# Patient Record
Sex: Male | Born: 1960 | Race: White | Hispanic: No | Marital: Single | Smoking: Current every day smoker
Health system: Southern US, Community
[De-identification: ages and names within clinical notes are randomized; demographics above are authoritative.]

## PROBLEM LIST (undated history)

## (undated) DIAGNOSIS — B1920 Unspecified viral hepatitis C without hepatic coma: Secondary | ICD-10-CM

---

## 1998-11-07 ENCOUNTER — Encounter: Payer: Self-pay | Admitting: Emergency Medicine

## 1998-11-07 ENCOUNTER — Emergency Department (HOSPITAL_COMMUNITY): Admission: EM | Admit: 1998-11-07 | Discharge: 1998-11-07 | Payer: Self-pay | Admitting: Emergency Medicine

## 1999-07-26 ENCOUNTER — Encounter: Admission: RE | Admit: 1999-07-26 | Discharge: 1999-10-24 | Payer: Self-pay

## 1999-09-15 ENCOUNTER — Emergency Department (HOSPITAL_COMMUNITY): Admission: EM | Admit: 1999-09-15 | Discharge: 1999-09-15 | Payer: Self-pay | Admitting: *Deleted

## 1999-11-26 ENCOUNTER — Emergency Department (HOSPITAL_COMMUNITY): Admission: EM | Admit: 1999-11-26 | Discharge: 1999-11-26 | Payer: Self-pay | Admitting: Emergency Medicine

## 1999-11-27 ENCOUNTER — Encounter: Payer: Self-pay | Admitting: Emergency Medicine

## 2000-08-14 ENCOUNTER — Emergency Department (HOSPITAL_COMMUNITY): Admission: EM | Admit: 2000-08-14 | Discharge: 2000-08-15 | Payer: Self-pay | Admitting: Emergency Medicine

## 2000-08-15 ENCOUNTER — Encounter: Payer: Self-pay | Admitting: Emergency Medicine

## 2004-08-29 ENCOUNTER — Inpatient Hospital Stay (HOSPITAL_COMMUNITY): Admission: EM | Admit: 2004-08-29 | Discharge: 2004-09-01 | Payer: Self-pay | Admitting: Psychiatry

## 2004-08-29 ENCOUNTER — Ambulatory Visit: Payer: Self-pay | Admitting: Psychiatry

## 2005-02-21 ENCOUNTER — Ambulatory Visit: Payer: Self-pay

## 2007-09-23 ENCOUNTER — Emergency Department (HOSPITAL_COMMUNITY): Admission: EM | Admit: 2007-09-23 | Discharge: 2007-09-23 | Payer: Self-pay | Admitting: Emergency Medicine

## 2007-10-02 ENCOUNTER — Emergency Department (HOSPITAL_COMMUNITY): Admission: EM | Admit: 2007-10-02 | Discharge: 2007-10-02 | Payer: Self-pay | Admitting: Emergency Medicine

## 2009-02-27 IMAGING — CR DG CHEST 2V
2 series · 2 of 2 positions shown · non-contrast
Comparison: none

CLINICAL DATA: Question Strep throat.  Smoker.
CHEST - 2 VIEW:

[w chest pa]
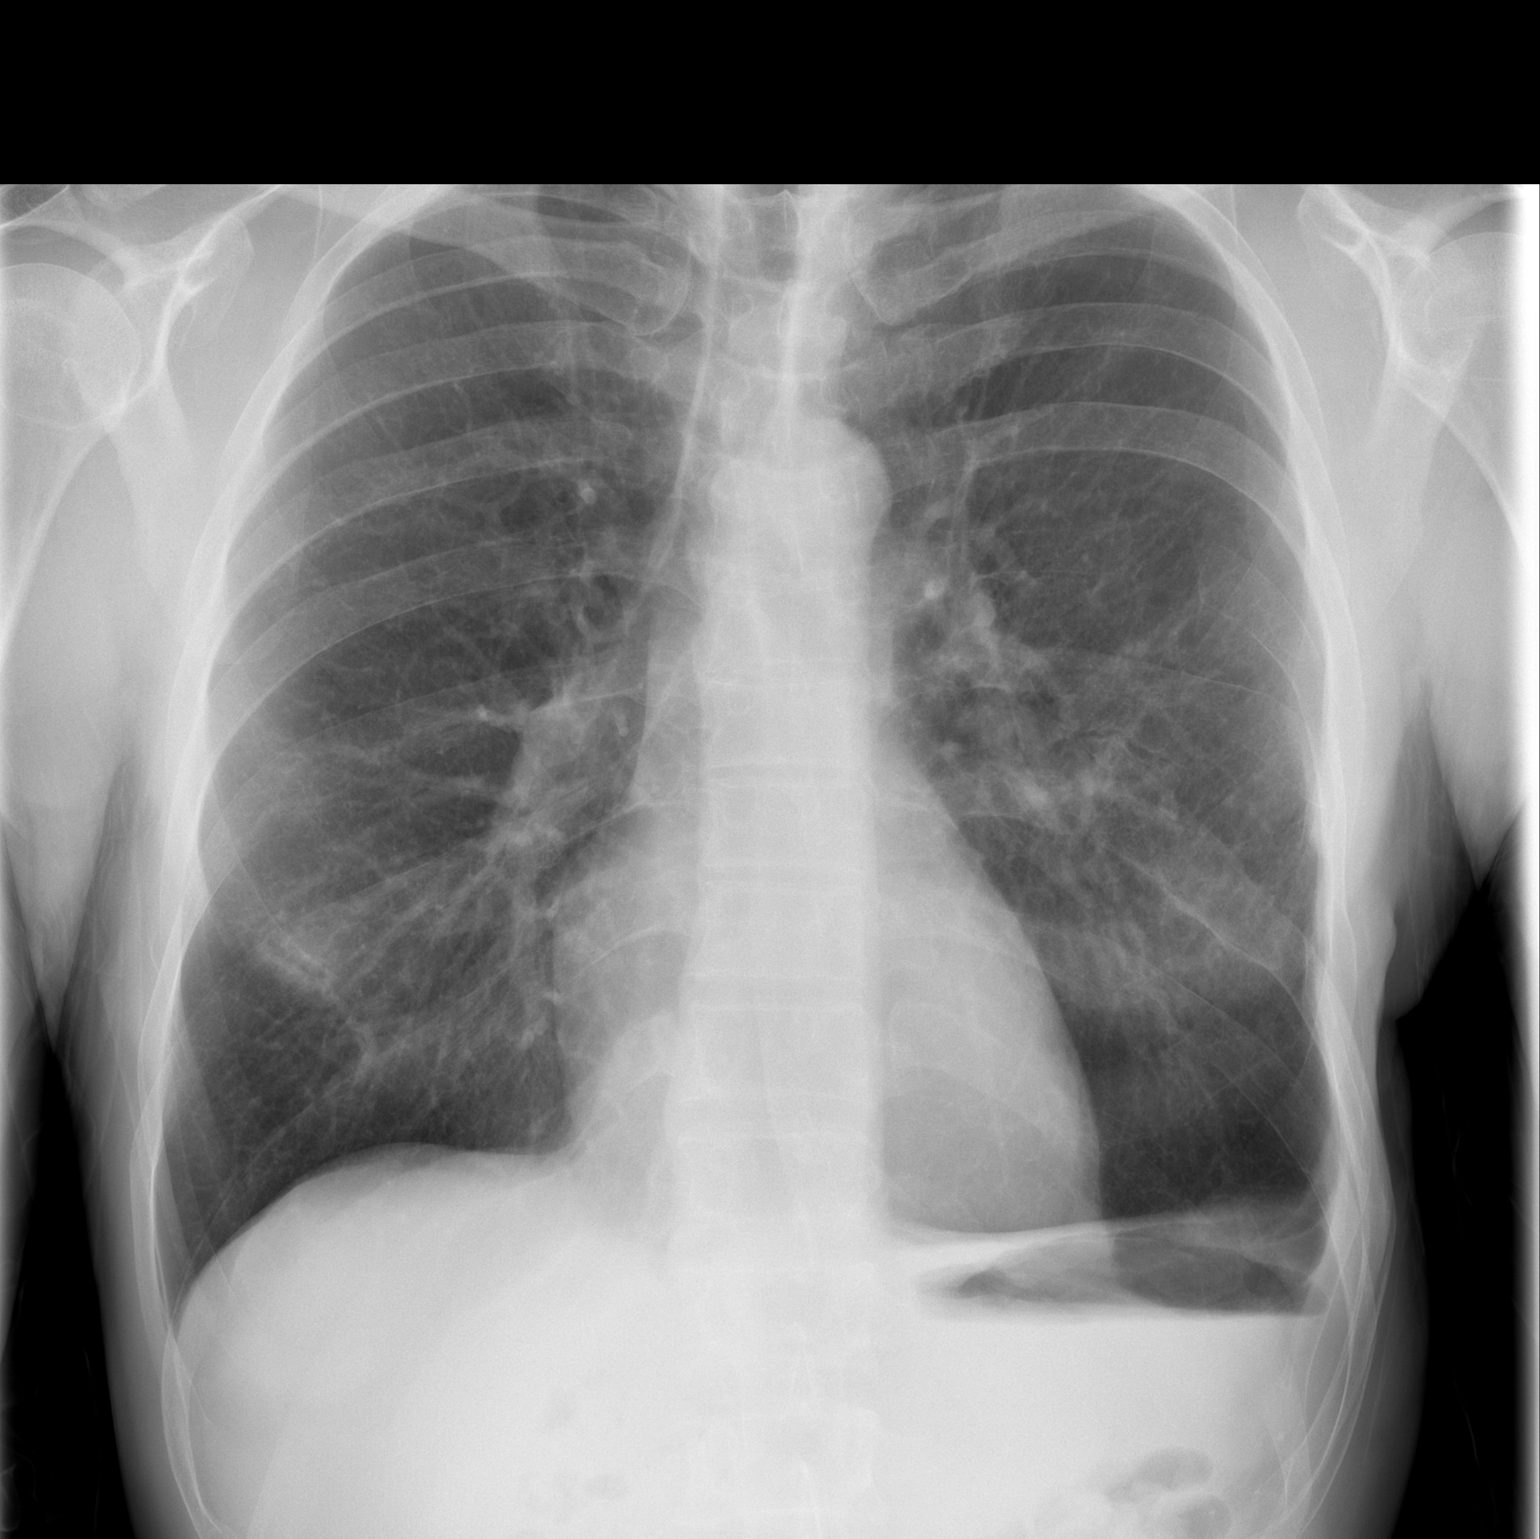

[w chest lat]
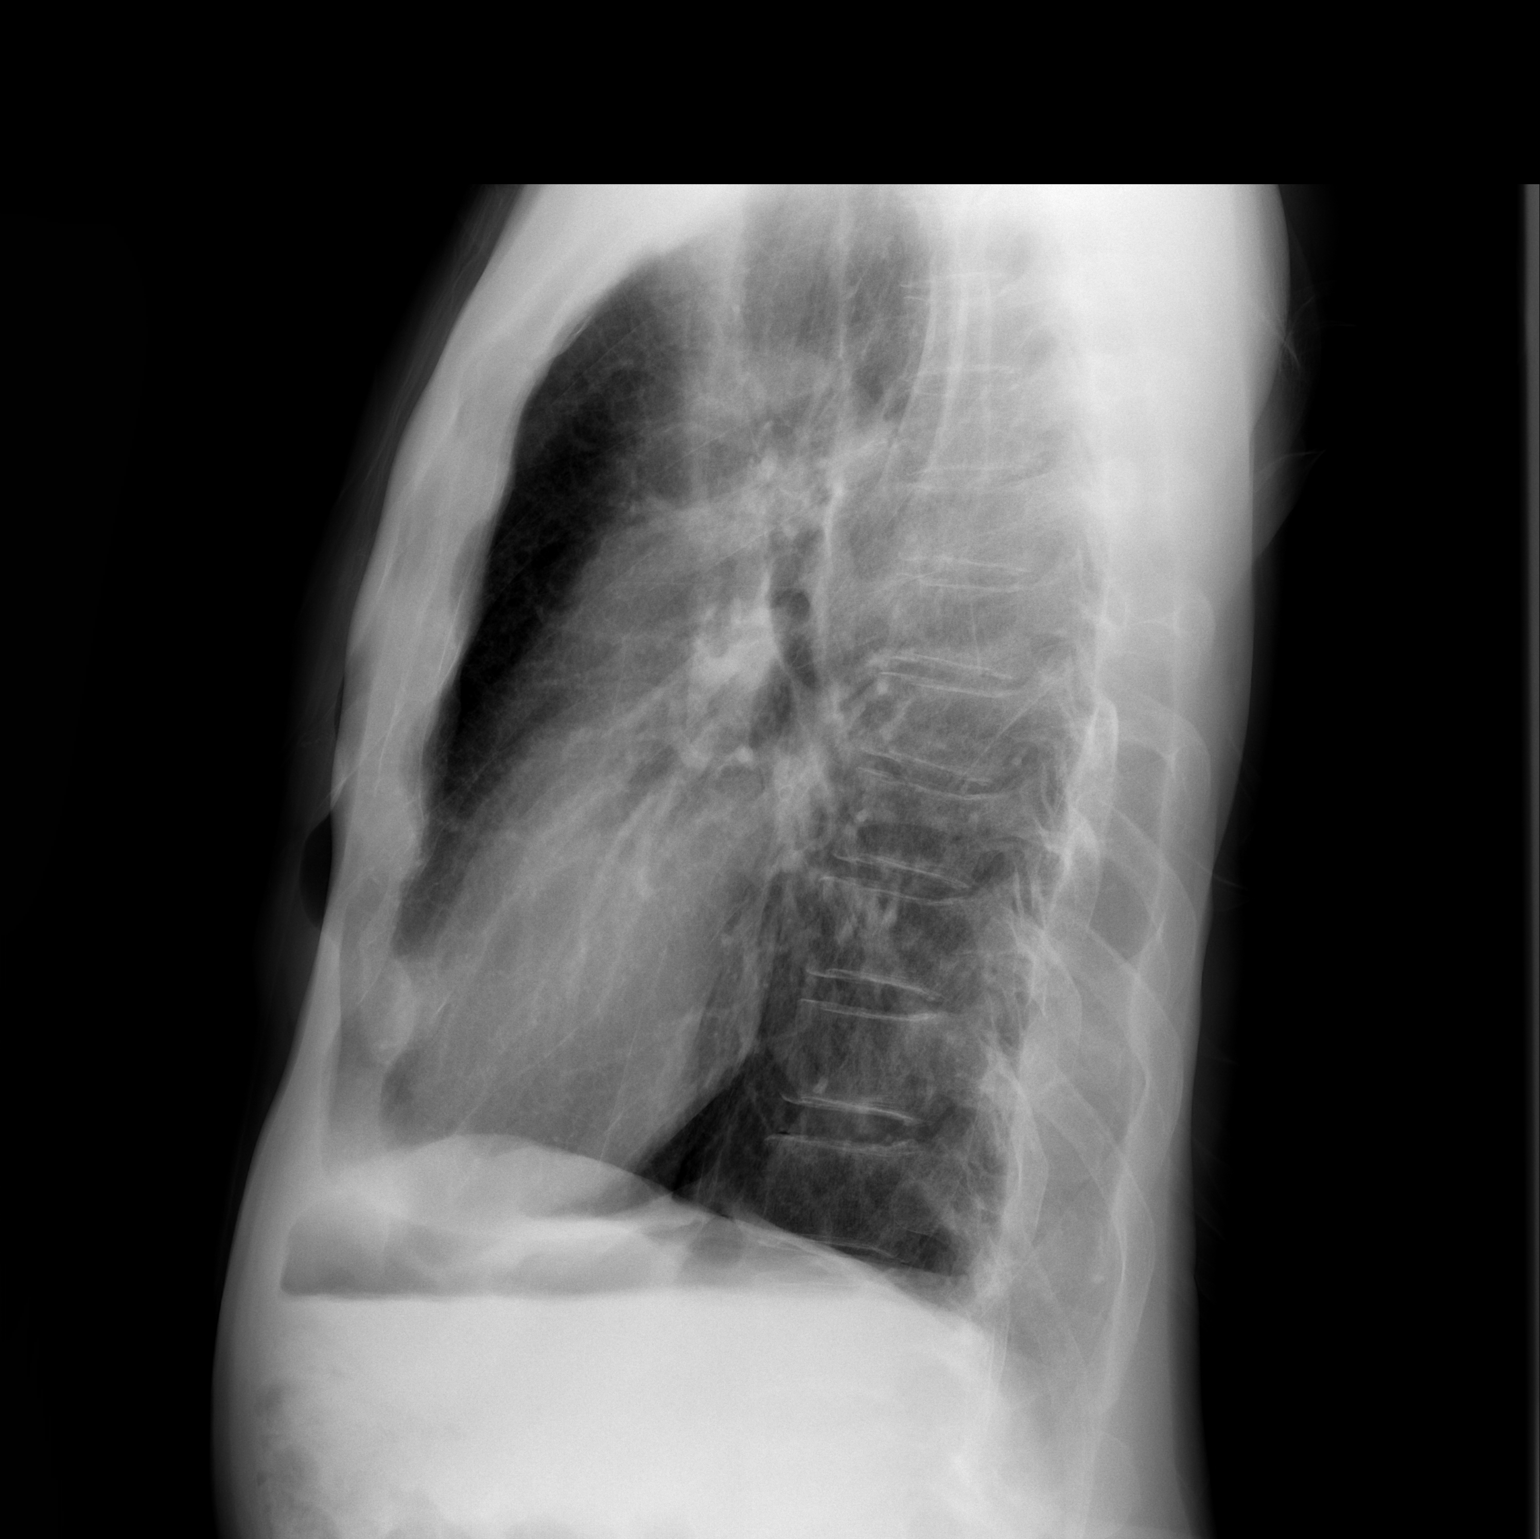

[2 of 2 positions shown; findings below may reference images not displayed]

FINDINGS: The lungs are hyperaerated compatible with COPD/emphysema.   Blunting of the left lateral costophrenic angle, likely chronic.   Normal cardiomediastinal silhouette.   Chronic-appearing rib findings bilaterally.   Probable prior resection of posterior aspect of left 7th rib.   Synostosis left 8th and 9th ribs.   Focal density right lower chest possibly related to the anterior aspect of the right 6th rib.   Right lower medial retrocardiac density of undetermined etiology.   Normal cardiac size.
IMPRESSION: COPD/emphysema.   Probable chronic blunting of left lateral costophrenic angle.   Negative for pneumonia.
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
## 2011-08-20 LAB — RAPID STREP SCREEN (MED CTR MEBANE ONLY): Streptococcus, Group A Screen (Direct): NEGATIVE

## 2016-05-31 ENCOUNTER — Emergency Department (HOSPITAL_COMMUNITY)
Admission: EM | Admit: 2016-05-31 | Discharge: 2016-05-31 | Disposition: A | Discharge status: Home / Self Care | Payer: Medicare Other | Attending: Emergency Medicine | Admitting: Emergency Medicine

## 2016-05-31 ENCOUNTER — Encounter (HOSPITAL_COMMUNITY): Payer: Self-pay | Admitting: *Deleted

## 2016-05-31 ENCOUNTER — Emergency Department (HOSPITAL_COMMUNITY): Payer: Medicare Other

## 2016-05-31 DIAGNOSIS — S20212A Contusion of left front wall of thorax, initial encounter: Secondary | ICD-10-CM | POA: Diagnosis not present

## 2016-05-31 DIAGNOSIS — W01198A Fall on same level from slipping, tripping and stumbling with subsequent striking against other object, initial encounter: Secondary | ICD-10-CM | POA: Diagnosis not present

## 2016-05-31 DIAGNOSIS — Y929 Unspecified place or not applicable: Secondary | ICD-10-CM | POA: Insufficient documentation

## 2016-05-31 DIAGNOSIS — Y999 Unspecified external cause status: Secondary | ICD-10-CM | POA: Insufficient documentation

## 2016-05-31 DIAGNOSIS — S299XXA Unspecified injury of thorax, initial encounter: Secondary | ICD-10-CM | POA: Diagnosis present

## 2016-05-31 DIAGNOSIS — F17210 Nicotine dependence, cigarettes, uncomplicated: Secondary | ICD-10-CM | POA: Diagnosis not present

## 2016-05-31 DIAGNOSIS — Y939 Activity, unspecified: Secondary | ICD-10-CM | POA: Diagnosis not present

## 2016-05-31 HISTORY — DX: Unspecified viral hepatitis C without hepatic coma: B19.20

## 2016-05-31 LAB — BASIC METABOLIC PANEL
ANION GAP: 5 (ref 5–15)
BUN: 13 mg/dL (ref 6–20)
CALCIUM: 9.7 mg/dL (ref 8.9–10.3)
CO2: 31 mmol/L (ref 22–32)
CREATININE: 0.87 mg/dL (ref 0.61–1.24)
Chloride: 105 mmol/L (ref 101–111)
GLUCOSE: 89 mg/dL (ref 65–99)
Potassium: 4.5 mmol/L (ref 3.5–5.1)
Sodium: 141 mmol/L (ref 135–145)

## 2016-05-31 LAB — CBC WITH DIFFERENTIAL/PLATELET
BASOS ABS: 0 10*3/uL (ref 0.0–0.1)
BASOS PCT: 1 %
EOS ABS: 0.3 10*3/uL (ref 0.0–0.7)
Eosinophils Relative: 3 %
HEMATOCRIT: 43.1 % (ref 39.0–52.0)
Hemoglobin: 15.1 g/dL (ref 13.0–17.0)
Lymphocytes Relative: 34 %
Lymphs Abs: 3 10*3/uL (ref 0.7–4.0)
MCH: 34.3 pg — ABNORMAL HIGH (ref 26.0–34.0)
MCHC: 35 g/dL (ref 30.0–36.0)
MCV: 98 fL (ref 78.0–100.0)
MONO ABS: 1 10*3/uL (ref 0.1–1.0)
Monocytes Relative: 12 %
NEUTROS ABS: 4.4 10*3/uL (ref 1.7–7.7)
NEUTROS PCT: 50 %
Platelets: 322 10*3/uL (ref 150–400)
RBC: 4.4 MIL/uL (ref 4.22–5.81)
RDW: 13.8 % (ref 11.5–15.5)
WBC: 8.8 10*3/uL (ref 4.0–10.5)

## 2016-05-31 MED ORDER — IBUPROFEN 400 MG PO TABS
800.0000 mg | ORAL_TABLET | Freq: Once | ORAL | Status: AC
  Administered 2016-05-31: 800 mg via ORAL
  Filled 2016-05-31: qty 2

## 2016-05-31 MED ORDER — IBUPROFEN 800 MG PO TABS: 800.0000 mg | ORAL_TABLET | Freq: Three times a day (TID) | ORAL | Status: DC

## 2016-05-31 NOTE — ED Provider Notes (Signed)
CSN: [id number]     Arrival date & time 05/31/16  1146 History  By signing my name below, I, Essence Howell, attest that this documentation has been prepared under the direction and in the presence of Noelle Penner, PA-C Electronically Signed: Charline Bills, ED Scribe 05/31/2016 at 1:50 PM.   Chief Complaint  Patient presents with  . Fall   The history is provided by the patient. No language interpreter was used.   HPI Comments: Nathaniel Logan is a 55 y.o. male, with a h/o hepatitis C, who presents to the Emergency Department complaining of gradually worsening left rib pain s/p a fall that occurred 2 weeks ago. Pt states that he slipped and fell, striking his left ribs on a trailer 2 weeks ago. He states that he re-injured his left ribs 2 days ago when he bent down to pick up a 40 lb pail of water and felt his left ribs "snap". He reports increased pain with coughing, deep breaths, palpation and movement. He has tried Motrin with minimal relief. Pt reports h/o IV drug use last summer but denies use since. He is currently being treated for Hep C.   Past Medical History  Diagnosis Date  . Hepatitis C    History reviewed. No pertinent past surgical history. History reviewed. No pertinent family history. Social History  Substance Use Topics  . Smoking status: Current Every Day Smoker    Types: Cigarettes  . Smokeless tobacco: None  . Alcohol Use: Yes     Comment: occ    Review of Systems  Musculoskeletal:       + L rib pain  All other systems reviewed and are negative.  Allergies  Penicillins  Home Medications   Prior to Admission medications   Not on File   BP 142/84 mmHg  Pulse 69  Temp(Src) 98.1 F (36.7 C) (Oral)  Resp 18  SpO2 97% Physical Exam  Constitutional: He is oriented to person, place, and time. He appears well-developed and well-nourished. No distress.  N.A.D.  HENT:  Head: Normocephalic and atraumatic.  Eyes: Conjunctivae and EOM are normal.  Neck: Neck  supple. No tracheal deviation present.  Cardiovascular: Normal rate and normal heart sounds.   Pulmonary/Chest: Effort normal and breath sounds normal. No respiratory distress.    L lower ribs tender to palpation with area of crepitus Equal expansion bilaterally  No flail chest Lung sounds clear and equal bilaterally   Musculoskeletal: Normal range of motion.  Neurological: He is alert and oriented to person, place, and time.  Skin: Skin is warm and dry.  Psychiatric: He has a normal mood and affect. His behavior is normal.  Nursing note and vitals reviewed.  ED Course  Procedures (including critical care time) DIAGNOSTIC STUDIES: Oxygen Saturation is 97% on RA, normal by my interpretation.    COORDINATION OF CARE: 12:10 PM-Discussed treatment plan which includes XR with pt at bedside and pt agreed to plan.   Labs Review Labs Reviewed  CBC WITH DIFFERENTIAL/PLATELET - Abnormal; Notable for the following:    MCH 34.3 (*)    All other components within normal limits  BASIC METABOLIC PANEL   Imaging Review Dg Ribs Unilateral W/chest Left  05/31/2016  CLINICAL DATA:  Status post fall, left rib pain EXAM: LEFT RIBS AND CHEST - 3+ VIEW COMPARISON:  10/02/2007 FINDINGS: Prior left 6 posterior rib resection. Posttraumatic changes involving the posterior seventh and eighth ribs. There is no evidence of pneumothorax or pleural effusion. The lungs are  hyperinflated likely secondary to COPD. Heart size and mediastinal contours are within normal limits. IMPRESSION: 1.  No acute osseous injury of the left ribs. Electronically Signed   By: Elige KoHetal  Patel   On: 05/31/2016 13:28   I have personally reviewed and evaluated these images and lab results as part of my medical decision-making.   EKG Interpretation None      MDM   Final diagnoses:  Rib contusion, left, initial encounter    XR negative for acute findings. Pt is breathing comfortably without hypoxia or increased WOB. We will hold  off on further emergent imaging at this time. Rx for ibuprofen given for pain. Strict ER return precautions given.   I personally performed the services described in this documentation, which was scribed in my presence. The recorded information has been reviewed and is accurate.   Carlene CoriaSerena Y Dewaine Morocho, PA-C 05/31/16 1419   Loren Raceravid Yelverton, MD 06/15/16 [phone]

## 2016-05-31 NOTE — ED Notes (Signed)
Pt transported to xray 

## 2016-05-31 NOTE — ED Notes (Signed)
Pt reports falling two weeks ago and had left rib pain since. Picked up something two days ago and "felt something pop" and having increase in rib pain. Airway intact with no resp distress.

## 2016-05-31 NOTE — Discharge Instructions (Signed)
Your chest x-ray today did not show any new findings. Take ibuprofen as prescribed as needed for pain. Please follow up with your primary care provider as soon as possible. Return to the ER for new or worsening symptoms.   Chest Contusion A chest contusion is a deep bruise on your chest area. Contusions are the result of an injury that caused bleeding under the skin. A chest contusion may involve bruising of the skin, muscles, or ribs. The contusion may turn blue, purple, or yellow. Minor injuries will give you a painless contusion, but more severe contusions may stay painful and swollen for a few weeks. CAUSES  A contusion is usually caused by a blow, trauma, or direct force to an area of the body. SYMPTOMS   Swelling and redness of the injured area.  Discoloration of the injured area.  Tenderness and soreness of the injured area.  Pain. DIAGNOSIS  The diagnosis can be made by taking a history and performing a physical exam. An X-ray, CT scan, or MRI may be needed to determine if there were any associated injuries, such as broken bones (fractures) or internal injuries. TREATMENT  Often, the best treatment for a chest contusion is resting, icing, and applying cold compresses to the injured area. Deep breathing exercises may be recommended to reduce the risk of pneumonia. Over-the-counter medicines may also be recommended for pain control. HOME CARE INSTRUCTIONS   Put ice on the injured area.  Put ice in a plastic bag.  Place a towel between your skin and the bag.  Leave the ice on for 15-20 minutes, 03-04 times a day.  Only take over-the-counter or prescription medicines as directed by your caregiver. Your caregiver may recommend avoiding anti-inflammatory medicines (aspirin, ibuprofen, and naproxen) for 48 hours because these medicines may increase bruising.  Rest the injured area.  Perform deep-breathing exercises as directed by your caregiver.  Stop smoking if you smoke.  Do  not lift objects over 5 pounds (2.3 kg) for 3 days or longer if recommended by your caregiver. SEEK IMMEDIATE MEDICAL CARE IF:   You have increased bruising or swelling.  You have pain that is getting worse.  You have difficulty breathing.  You have dizziness, weakness, or fainting.  You have blood in your urine or stool.  You cough up or vomit blood.  Your swelling or pain is not relieved with medicines. MAKE SURE YOU:   Understand these instructions.  Will watch your condition.  Will get help right away if you are not doing well or get worse.   This information is not intended to replace advice given to you by your health care provider. Make sure you discuss any questions you have with your health care provider.   Document Released: 07/23/2001 Document Revised: 07/22/2012 Document Reviewed: 04/20/2012 Elsevier Interactive Patient Education 2016 Else[employer] Inc[employer]   Rib Contusion A rib contusion is a deep bruise on your rib area. Contusions are the result of a blunt trauma that causes bleeding and injury to the tissues under the skin. A rib contusion may involve bruising of the ribs and of the skin and muscles in the area. The skin overlying the contusion may turn blue, purple, or yellow. Minor injuries will give you a painless contusion, but more severe contusions may stay painful and swollen for a few weeks. CAUSES  A contusion is usually caused by a blow, trauma, or direct force to an area of the body. This often occurs while playing contact sports. SYMPTOMS  Swelling  and redness of the injured area.  Discoloration of the injured area.  Tenderness and soreness of the injured area.  Pain with or without movement. DIAGNOSIS  The diagnosis can be made by taking a medical history and performing a physical exam. An X-ray, CT scan, or MRI may be needed to determine if there were any associated injuries, such as broken bones (fractures) or internal injuries. TREATMENT  Often,  the best treatment for a rib contusion is rest. Icing or applying cold compresses to the injured area may help reduce swelling and inflammation. Deep breathing exercises may be recommended to reduce the risk of partial lung collapse and pneumonia. Over-the-counter or prescription medicines may also be recommended for pain control. HOME CARE INSTRUCTIONS   Apply ice to the injured area:  Put ice in a plastic bag.  Place a towel between your skin and the bag.  Leave the ice on for 20 minutes, 2-3 times per day.  Take medicines only as directed by your health care provider.  Rest the injured area. Avoid strenuous activity and any activities or movements that cause pain. Be careful during activities and avoid bumping the injured area.  Perform deep-breathing exercises as directed by your health care provider.  Do not lift anything that is heavier than 5 lb (2.3 kg) until your health care provider approves.  Do not use any tobacco products, including cigarettes, chewing tobacco, or electronic cigarettes. If you need help quitting, ask your health care provider. SEEK MEDICAL CARE IF:   You have increased bruising or swelling.  You have pain that is not controlled with treatment.  You have a fever. SEEK IMMEDIATE MEDICAL CARE IF:   You have difficulty breathing or shortness of breath.  You develop a continual cough, or you cough up thick or bloody sputum.  You feel sick to your stomach (nauseous), you throw up (vomit), or you have abdominal pain.   This information is not intended to replace advice given to you by your health care provider. Make sure you discuss any questions you have with your health care provider.   Document Released: 07/23/2001 Document Revised: 11/18/2014 Document Reviewed: 08/09/2014 Elsevier Interactive Patient Education [employer]

## 2017-02-03 ENCOUNTER — Emergency Department (HOSPITAL_COMMUNITY)
Admission: EM | Admit: 2017-02-03 | Discharge: 2017-02-03 | Disposition: A | Discharge status: Home / Self Care | Payer: Medicare Other | Attending: Emergency Medicine | Admitting: Emergency Medicine

## 2017-02-03 ENCOUNTER — Encounter (HOSPITAL_COMMUNITY): Payer: Self-pay

## 2017-02-03 DIAGNOSIS — F17210 Nicotine dependence, cigarettes, uncomplicated: Secondary | ICD-10-CM | POA: Diagnosis not present

## 2017-02-03 DIAGNOSIS — R0989 Other specified symptoms and signs involving the circulatory and respiratory systems: Secondary | ICD-10-CM | POA: Diagnosis present

## 2017-02-03 MED ORDER — GI COCKTAIL ~~LOC~~
30.0000 mL | Freq: Once | ORAL | Status: AC
  Administered 2017-02-03: 30 mL via ORAL
  Filled 2017-02-03: qty 30

## 2017-02-03 NOTE — ED Notes (Signed)
ED Provider at bedside. 

## 2017-02-03 NOTE — ED Triage Notes (Signed)
Pt states ate cake, states cake covered with plastic wrap. Pt states feels plastic wrap stuck in throat. Pt with no SOB or difficulty breathing. VSS. NAD. Pt states no difficulty swallowing.

## 2017-02-03 NOTE — Discharge Instructions (Signed)
1. Medications: usual home medications 2. Treatment: rest, drink plenty of fluids, soft diet x 3 days 3. Follow Up: Please followup with your primary doctor in 2-3 days for discussion of your diagnoses and further evaluation after today's visit; if you do not have a primary care doctor use the resource guide provided to find one; Please return to the ER for difficulty breathing, difficulty swallowing or other concerns
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
## 2017-02-03 NOTE — ED Notes (Signed)
Patient Alert and oriented X4. Stable and ambulatory. Patient verbalized understanding of the discharge instructions.  Patient belongings were taken by the patient.  

## 2017-02-03 NOTE — ED Notes (Signed)
Silverio LayYao MD and Dahlia ClientHannah PA at bedside for ENT rhinolaryngeal scope.  Patient tolerating procedure well.  No acute distress noted throughout procedure.

## 2019-10-24 ENCOUNTER — Other Ambulatory Visit: Payer: Self-pay

## 2019-10-24 ENCOUNTER — Encounter: Payer: Self-pay | Admitting: Emergency Medicine

## 2019-10-24 DIAGNOSIS — K565 Intestinal adhesions [bands], unspecified as to partial versus complete obstruction: Secondary | ICD-10-CM | POA: Diagnosis not present

## 2019-10-24 DIAGNOSIS — Z791 Long term (current) use of non-steroidal anti-inflammatories (NSAID): Secondary | ICD-10-CM

## 2019-10-24 DIAGNOSIS — J449 Chronic obstructive pulmonary disease, unspecified: Secondary | ICD-10-CM | POA: Diagnosis present

## 2019-10-24 DIAGNOSIS — F17200 Nicotine dependence, unspecified, uncomplicated: Secondary | ICD-10-CM | POA: Diagnosis present

## 2019-10-24 DIAGNOSIS — B1920 Unspecified viral hepatitis C without hepatic coma: Secondary | ICD-10-CM | POA: Diagnosis present

## 2019-10-24 DIAGNOSIS — Z20828 Contact with and (suspected) exposure to other viral communicable diseases: Secondary | ICD-10-CM | POA: Diagnosis present

## 2019-10-24 DIAGNOSIS — Z9081 Acquired absence of spleen: Secondary | ICD-10-CM

## 2019-10-24 DIAGNOSIS — Z5329 Procedure and treatment not carried out because of patient's decision for other reasons: Secondary | ICD-10-CM | POA: Diagnosis not present

## 2019-10-24 DIAGNOSIS — Z880 Allergy status to penicillin: Secondary | ICD-10-CM

## 2019-10-24 LAB — COMPREHENSIVE METABOLIC PANEL
ALT: 16 U/L (ref 0–44)
AST: 21 U/L (ref 15–41)
Albumin: 4.6 g/dL (ref 3.5–5.0)
Alkaline Phosphatase: 64 U/L (ref 38–126)
Anion gap: 14 (ref 5–15)
BUN: 23 mg/dL — ABNORMAL HIGH (ref 6–20)
CO2: 26 mmol/L (ref 22–32)
Calcium: 9.8 mg/dL (ref 8.9–10.3)
Chloride: 98 mmol/L (ref 98–111)
Creatinine, Ser: 0.89 mg/dL (ref 0.61–1.24)
GFR calc Af Amer: >60 mL/min (ref >60)
GFR calc non Af Amer: >60 mL/min (ref >60)
Glucose, Bld: 141 mg/dL — ABNORMAL HIGH (ref 70–99)
Potassium: 4.1 mmol/L (ref 3.5–5.1)
Sodium: 138 mmol/L (ref 135–145)
Total Bilirubin: 1.7 mg/dL — ABNORMAL HIGH (ref 0.3–1.2)
Total Protein: 8.3 g/dL — ABNORMAL HIGH (ref 6.5–8.1)

## 2019-10-24 LAB — CBC
HCT: 49 % (ref 39.0–52.0)
Hemoglobin: 17.6 g/dL — ABNORMAL HIGH (ref 13.0–17.0)
MCH: 34.6 pg — ABNORMAL HIGH (ref 26.0–34.0)
MCHC: 35.9 g/dL (ref 30.0–36.0)
MCV: 96.5 fL (ref 80.0–100.0)
Platelets: 325 10*3/uL (ref 150–400)
RBC: 5.08 MIL/uL (ref 4.22–5.81)
RDW: 13.6 % (ref 11.5–15.5)
WBC: 15.3 10*3/uL — ABNORMAL HIGH (ref 4.0–10.5)
nRBC: 0 % (ref 0.0–0.2)

## 2019-10-24 LAB — LIPASE, BLOOD: Lipase: 19 U/L (ref 11–51)

## 2019-10-24 LAB — URINALYSIS, COMPLETE (UACMP) WITH MICROSCOPIC
Bacteria, UA: NONE SEEN
Bilirubin Urine: NEGATIVE
Glucose, UA: NEGATIVE mg/dL
Hgb urine dipstick: NEGATIVE
Ketones, ur: 20 mg/dL — AB
Leukocytes,Ua: NEGATIVE
Nitrite: NEGATIVE
Protein, ur: 30 mg/dL — AB
Specific Gravity, Urine: 1.026 (ref 1.005–1.030)
Squamous Epithelial / LPF: NONE SEEN (ref 0–5)
pH: 5 (ref 5.0–8.0)

## 2019-10-24 MED ORDER — SODIUM CHLORIDE 0.9% FLUSH: 3.0000 mL | Freq: Once | INTRAVENOUS | Status: DC

## 2019-10-24 NOTE — ED Triage Notes (Signed)
Pt to triage via wheelchair. Pt reports he ate unwashed raspberries on Saturday morning and soon after developed abd pain and n/v. Pt has the raspberries with him as asked if we could check them. Told pt he could take them home or throw them away but we could not test the raspberries.

## 2019-10-24 NOTE — ED Triage Notes (Signed)
First Nurse Note: patient brought in by [facility]. Patient reports that he thinks that he has food poisoning. Patient states that ate raspberries Saturday morning around 03:30 and about 2 hours later that developed abdominal pain and vomiting.

## 2019-10-25 ENCOUNTER — Emergency Department: Payer: Medicare HMO

## 2019-10-25 ENCOUNTER — Inpatient Hospital Stay
Admission: EM | Admit: 2019-10-25 | Discharge: 2019-10-25 | DRG: 390 | Discharge status: Against Medical Advice | Payer: Medicare HMO | Attending: Internal Medicine | Admitting: Internal Medicine

## 2019-10-25 ENCOUNTER — Encounter: Payer: Self-pay | Admitting: Radiology

## 2019-10-25 ENCOUNTER — Observation Stay: Payer: Medicare HMO

## 2019-10-25 DIAGNOSIS — K56609 Unspecified intestinal obstruction, unspecified as to partial versus complete obstruction: Secondary | ICD-10-CM | POA: Diagnosis not present

## 2019-10-25 DIAGNOSIS — F17200 Nicotine dependence, unspecified, uncomplicated: Secondary | ICD-10-CM | POA: Diagnosis present

## 2019-10-25 DIAGNOSIS — D72829 Elevated white blood cell count, unspecified: Secondary | ICD-10-CM

## 2019-10-25 DIAGNOSIS — J42 Unspecified chronic bronchitis: Secondary | ICD-10-CM

## 2019-10-25 DIAGNOSIS — Z9081 Acquired absence of spleen: Secondary | ICD-10-CM | POA: Diagnosis not present

## 2019-10-25 DIAGNOSIS — K565 Intestinal adhesions [bands], unspecified as to partial versus complete obstruction: Secondary | ICD-10-CM | POA: Diagnosis present

## 2019-10-25 DIAGNOSIS — Z5329 Procedure and treatment not carried out because of patient's decision for other reasons: Secondary | ICD-10-CM | POA: Diagnosis not present

## 2019-10-25 DIAGNOSIS — Z791 Long term (current) use of non-steroidal anti-inflammatories (NSAID): Secondary | ICD-10-CM | POA: Diagnosis not present

## 2019-10-25 DIAGNOSIS — J449 Chronic obstructive pulmonary disease, unspecified: Secondary | ICD-10-CM | POA: Diagnosis present

## 2019-10-25 DIAGNOSIS — Z880 Allergy status to penicillin: Secondary | ICD-10-CM | POA: Diagnosis not present

## 2019-10-25 DIAGNOSIS — Z20828 Contact with and (suspected) exposure to other viral communicable diseases: Secondary | ICD-10-CM | POA: Diagnosis present

## 2019-10-25 DIAGNOSIS — B1920 Unspecified viral hepatitis C without hepatic coma: Secondary | ICD-10-CM | POA: Diagnosis present

## 2019-10-25 LAB — SARS CORONAVIRUS 2 (TAT 6-24 HRS): SARS Coronavirus 2: NEGATIVE

## 2019-10-25 MED ORDER — SODIUM CHLORIDE 0.9 % IV BOLUS
1000.0000 mL | Freq: Once | INTRAVENOUS | Status: AC
  Administered 2019-10-25: 1000 mL via INTRAVENOUS

## 2019-10-25 MED ORDER — ENOXAPARIN SODIUM 40 MG/0.4ML ~~LOC~~ SOLN
40.0000 mg | SUBCUTANEOUS | Status: DC
  Filled 2019-10-25 (×2): qty 0.4

## 2019-10-25 MED ORDER — MORPHINE SULFATE (PF) 2 MG/ML IV SOLN
2.0000 mg | INTRAVENOUS | Status: DC | PRN
  Administered 2019-10-25: 2 mg via INTRAVENOUS
  Filled 2019-10-25: qty 1

## 2019-10-25 MED ORDER — ONDANSETRON HCL 4 MG/2ML IJ SOLN
4.0000 mg | Freq: Four times a day (QID) | INTRAMUSCULAR | Status: DC | PRN
  Administered 2019-10-25: 10:00:00 4 mg via INTRAVENOUS
  Filled 2019-10-25: qty 2

## 2019-10-25 MED ORDER — LORAZEPAM 2 MG/ML IJ SOLN
0.5000 mg | Freq: Once | INTRAMUSCULAR | Status: AC | PRN
  Administered 2019-10-25: 08:00:00 0.5 mg via INTRAVENOUS
  Filled 2019-10-25: qty 1

## 2019-10-25 MED ORDER — IOHEXOL 300 MG/ML  SOLN
100.0000 mL | Freq: Once | INTRAMUSCULAR | Status: AC | PRN
  Administered 2019-10-25: 04:00:00 100 mL via INTRAVENOUS

## 2019-10-25 MED ORDER — ALBUTEROL SULFATE (2.5 MG/3ML) 0.083% IN NEBU: 2.5000 mg | INHALATION_SOLUTION | RESPIRATORY_TRACT | Status: DC | PRN

## 2019-10-25 MED ORDER — MORPHINE SULFATE (PF) 2 MG/ML IV SOLN
2.0000 mg | Freq: Once | INTRAVENOUS | Status: AC
  Administered 2019-10-25: 04:00:00 2 mg via INTRAVENOUS
  Filled 2019-10-25: qty 1

## 2019-10-25 MED ORDER — ONDANSETRON HCL 4 MG PO TABS
4.0000 mg | ORAL_TABLET | Freq: Four times a day (QID) | ORAL | Status: DC | PRN
  Filled 2019-10-25: qty 1

## 2019-10-25 MED ORDER — MORPHINE SULFATE (PF) 2 MG/ML IV SOLN
2.0000 mg | INTRAVENOUS | Status: DC | PRN
  Administered 2019-10-25: 14:00:00 2 mg via INTRAVENOUS
  Filled 2019-10-25: qty 1

## 2019-10-25 MED ORDER — MOMETASONE FURO-FORMOTEROL FUM 100-5 MCG/ACT IN AERO
2.0000 | INHALATION_SPRAY | Freq: Two times a day (BID) | RESPIRATORY_TRACT | Status: DC
  Filled 2019-10-25: qty 8.8

## 2019-10-25 MED ORDER — DEXTROSE-NACL 5-0.45 % IV SOLN
INTRAVENOUS | Status: DC
  Administered 2019-10-25: 08:00:00 via INTRAVENOUS

## 2019-10-25 MED ORDER — MORPHINE SULFATE (PF) 4 MG/ML IV SOLN: 4.0000 mg | INTRAVENOUS | Status: DC | PRN

## 2019-10-25 MED ORDER — TIOTROPIUM BROMIDE MONOHYDRATE 18 MCG IN CAPS
18.0000 ug | ORAL_CAPSULE | Freq: Every day | RESPIRATORY_TRACT | Status: DC
  Filled 2019-10-25: qty 5

## 2019-10-25 MED ORDER — ONDANSETRON HCL 4 MG/2ML IJ SOLN
4.0000 mg | Freq: Once | INTRAMUSCULAR | Status: AC
  Administered 2019-10-25: 4 mg via INTRAVENOUS
  Filled 2019-10-25: qty 2

## 2019-10-25 NOTE — ED Notes (Signed)
Admitting MD notified of patient c/o feeling like the NG tube is "rubbing him raw" from the inside. This RN explained that per the X-ray the tube was in a good position, pt however states "it's going to rub me raw, I can feel it".

## 2019-10-25 NOTE — ED Provider Notes (Signed)
[facility] Emergency Department Provider Note _______________   First MD Initiated Contact with Patient 10/25/19 [phone]     (approximate)  I have reviewed the triage vital signs and the nursing notes.   HISTORY  Chief Complaint Abdominal Pain and Emesis   HPI Nathaniel Logan is a 58 y.o. male presents to the emergency department secondary to 8 out of 10 upper abdominal pain which patient states began Saturday morning and has been persistently worsening since onset.  Patient admits to nausea and vomiting as well.  Patient does admit to "a little" nonbloody diarrhea.  Patient states that symptoms began 1-1/2 hours after eating raspberries that were not washed.  Patient denies any fever.        Past Medical History:  Diagnosis Date  . Hepatitis C     There are no problems to display for this patient.   History reviewed. No pertinent surgical history.  Prior to Admission medications   Medication Sig Start Date End Date Taking? Authorizing Provider  ibuprofen (ADVIL,MOTRIN) 800 MG tablet Take 1 tablet (800 mg total) by mouth 3 (three) times daily. 05/31/16   Sam, Ace GinsSerena Y, PA-C    Allergies Penicillins  History reviewed. No pertinent family history.  Social History Social History   Tobacco Use  . Smoking status: Current Every Day Smoker    Types: Cigarettes  . Smokeless tobacco: Never Used  Substance Use Topics  . Alcohol use: Yes    Comment: occ  . Drug use: No    Review of Systems Constitutional: No fever/chills Eyes: No visual changes. ENT: No sore throat. Cardiovascular: Denies chest pain. Respiratory: Denies shortness of breath. Gastrointestinal: No abdominal pain.  No nausea, no vomiting.  No diarrhea.  No constipation. Genitourinary: Negative for dysuria. Musculoskeletal: Negative for neck pain.  Negative for back pain. Integumentary: Negative for rash. Neurological: Negative for headaches, focal weakness or  numbness.   ____________________________________________   PHYSICAL EXAM:  VITAL SIGNS: ED Triage Vitals  Enc Vitals Group     BP 10/24/19 2143 (!) 127/94     Pulse Rate 10/24/19 2143 91     Resp 10/24/19 2143 18     Temp 10/24/19 2143 98.9 F (37.2 C)     Temp Source 10/24/19 2143 Oral     SpO2 10/24/19 2128 96 %     Weight 10/24/19 2144 77.1 kg (170 lb)     Height 10/24/19 2144 1.829 m (6')     Head Circumference --      Peak Flow --      Pain Score 10/24/19 2144 9     Pain Loc --      Pain Edu? --      Excl. in GC? --     Constitutional: Alert and oriented. Eyes: Conjunctivae are normal.  Mouth/Throat: Patient is wearing a mask. Neck: No stridor.  No meningeal signs.   Cardiovascular: Normal rate, regular rhythm. Good peripheral circulation. Grossly normal heart sounds. Respiratory: Normal respiratory effort.  No retractions. Gastrointestinal: Right upper quadrant epigastric and left upper quadrant tenderness to palpation.. No distention.   Musculoskeletal: No lower extremity tenderness nor edema. No gross deformities of extremities. Neurologic:  Normal speech and language. No gross focal neurologic deficits are appreciated.  Skin:  Skin is warm, dry and intact. Psychiatric: Mood and affect are normal. Speech and behavior are normal.  ____________________________________________   LABS (all labs ordered are listed, but only abnormal results are displayed)  Labs Reviewed  COMPREHENSIVE  METABOLIC PANEL - Abnormal; Notable for the following components:      Result Value   Glucose, Bld 141 (*)    BUN 23 (*)    Total Protein 8.3 (*)    Total Bilirubin 1.7 (*)    All other components within normal limits  CBC - Abnormal; Notable for the following components:   WBC 15.3 (*)    Hemoglobin 17.6 (*)    MCH 34.6 (*)    All other components within normal limits  URINALYSIS, COMPLETE (UACMP) WITH MICROSCOPIC - Abnormal; Notable for the following components:   Color,  Urine AMBER (*)    APPearance CLEAR (*)    Ketones, ur 20 (*)    Protein, ur 30 (*)    All other components within normal limits  LIPASE, BLOOD   ____________________________________________  EKG  ED ECG REPORT I, Quincy N Danialle Dement, the attending physician, personally viewed and interpreted this ECG.   Date: 10/25/2019  EKG Time: 9:52 PM  Rate: 81  Rhythm: Normal sinus rhythm  Axis: Normal  Intervals: Normal  ST&T Change: None  ____________________________________________  RADIOLOGY I, [place] Rise Traeger, personally viewed and evaluated these images (plain radiographs) as part of my medical decision making, as well as reviewing the written report by the radiologist.  ED MD interpretation: High-grade small bowel obstruction with transition point deep to the umbilicus likely secondary to adhesions per radiologist.  Splenomegaly also noted  Official radiology report(s): CT ABDOMEN PELVIS W CONTRAST  Result Date: 10/25/2019 CLINICAL DATA:  Acute generalized abdominal pain. Suspected food poisoning EXAM: CT ABDOMEN AND PELVIS WITH CONTRAST TECHNIQUE: Multidetector CT imaging of the abdomen and pelvis was performed using the standard protocol following bolus administration of intravenous contrast. CONTRAST:  151mL OMNIPAQUE IOHEXOL 300 MG/ML  SOLN COMPARISON:  09/20/2019 abdominal CT FINDINGS: Lower chest:  Hyperinflation with right lower lobe scarring. Hepatobiliary: Small cysts in the lower central liver an even smaller cystic density focus in the subcapsular right liver.No evidence of biliary obstruction or stone. Pancreas: Unremarkable. Spleen: Surgically absent Adrenals/Urinary Tract: Negative adrenals. No hydronephrosis or stone. Unremarkable bladder. Stomach/Bowel: Dilated and fluid-filled small bowel with fecalization before an abrupt transition point to decompressed bowel, high-grade obstruction. The transition point is in the central ventral abdomen just below the umbilicus where  there is angulation of bowel, presumably from adhesions. No visible underlying mass or inflammatory bowel wall thickening. No appendicitis. Vascular/Lymphatic: Mild atherosclerotic calcification of the aorta and iliacs. No mass or adenopathy. Reproductive:Dystrophic prostate calcification. Phlebolith like calcifications in the spermatic cords and corpora. Other: Small pelvic ascites considered reactive. Musculoskeletal: Disc and facet degeneration at L2-3 and below with multilevel spinal stenosis accentuated by short pedicles. IMPRESSION: 1. High-grade small bowel obstruction with transition point just deep to the umbilicus, likely adhesion. 2. Splenectomy. Electronically Signed   By: Monte Fantasia M.D.   On: 10/25/2019 04:31      Procedures   ____________________________________________   INITIAL IMPRESSION / MDM / ASSESSMENT AND PLAN / ED COURSE  As part of my medical decision making, I reviewed the following data within the electronic MEDICAL RECORD NUMBER   58 year old male presented with above-stated history and physical exam secondary to abdominal pain differential diagnosis includes gastroenteritis, diverticulitis gastritis colitis small bowel obstruction as such CT scan of the abdomen pelvis performed which revealed a high-grade small bowel obstruction.  Patient discussed with Dr. Christian Mate general surgeon on-call who will consult and recommends the patient be admitted to the hospital staff.  NG tube ordered.  Patient received 1 L IV normal saline morphine 2 mg Zofran 4 mg  ____________________________________________  FINAL CLINICAL IMPRESSION(S) / ED DIAGNOSES  Final diagnoses:  Small bowel obstruction (HCC)     MEDICATIONS GIVEN DURING THIS VISIT:  Medications  sodium chloride flush (NS) 0.9 % injection 3 mL (has no administration in time range)  sodium chloride 0.9 % bolus 1,000 mL (1,000 mLs Intravenous New Bag/Given 10/25/19 0402)  ondansetron (ZOFRAN) injection 4 mg (4 mg  Intravenous Given 10/25/19 0402)  morphine 2 MG/ML injection 2 mg (2 mg Intravenous Given 10/25/19 0402)  iohexol (OMNIPAQUE) 300 MG/ML solution 100 mL (100 mLs Intravenous Contrast Given 10/25/19 1660)     ED Discharge Orders    None      *Please note:  Nathaniel Logan was evaluated in Emergency Department on 10/25/2019 for the symptoms described in the history of present illness. He was evaluated in the context of the global COVID-19 pandemic, which necessitated consideration that the patient might be at risk for infection with the SARS-CoV-2 virus that causes COVID-19. Institutional protocols and algorithms that pertain to the evaluation of patients at risk for COVID-19 are in a state of rapid change based on information released by regulatory bodies including the CDC and federal and state organizations. These policies and algorithms were followed during the patient's care in the ED.  Some ED evaluations and interventions may be delayed as a result of limited staffing during the pandemic.*  Note:  This document was prepared using Dragon voice recognition software and may include unintentional dictation errors.   Darci Current, MD 10/25/19 [phone]

## 2019-10-25 NOTE — ED Notes (Signed)
This RN to bedside, pt c/o feeling like the NG tube is going to rub him raw "on the inside" and points to his stomach, this RN attempted to explain per X-ray NG tube is in a good position, pt states "but I can feel it". Pt repositioned, lights dimmed for patient comfort. Explained would speak with admitting MD.

## 2019-10-25 NOTE — ED Notes (Signed)
Pt states "I don't want anything but my pain medicine, I told you before I need my pain medicine on schedule". Pt noted to be starting to get aggressive with this RN. This RN explained to patient that this RN does not wake sleeping patients up to administer narcotics. Pt refusing other medications at this time.

## 2019-10-25 NOTE — H&P (Signed)
History and Physical    Nathaniel Logan YNW:[id number] DOB: 02-Jan-1961 DOA: 10/25/2019  I have briefly reviewed the patient's prior medical records in [place]  PCP: Egbert Garibaldi, [place]  Patient coming from: home  Chief Complaint: Abdominal pain, nausea vomiting  HPI: Nathaniel Logan is a 58 y.o. male with medical history significant of COPD prior intra-abdominal surgeries, comes to the hospital with chief complaint of abdominal pain, nausea and vomiting.  This has been going on for the past couple of days, initially patient thought that he has food poisoning as he ate some unwashed berries.  He denies any fever or chills, however complains of alternating sweats with feeling cold, denies any chest pain, denies any shortness of breath.  He is a smoker and continues to smoke.  He denies any palpitations.  No lightheadedness or dizziness.  ED Course: In the emergency room he is afebrile and otherwise vitals are stable satting well on room air.  Blood work remarkable for white count of 15.3 and hemoglobin of 17.6,.  Total bilirubin is 1.7.  SARS-CoV-2 pending.  He underwent a CT scan of the abdomen and pelvis which showed a high-grade small bowel obstruction with transition point just deep to the umbilicus, possibly due to adhesions.  General surgery was consulted and we are asked to admit  Review of Systems: All systems reviewed, and apart from HPI, all negative  Past Medical History:  Diagnosis Date   Hepatitis C     History reviewed. No pertinent surgical history.   reports that he has been smoking cigarettes. He has never used smokeless tobacco. He reports current alcohol use. He reports that he does not use drugs.  Allergies  Allergen Reactions   Penicillins     History reviewed. No pertinent family history.  Prior to Admission medications   Medication Sig Start Date End Date Taking? Authorizing Provider  ibuprofen (ADVIL,MOTRIN) 800 MG tablet Take 1 tablet (800 mg total)  by mouth 3 (three) times daily. 05/31/16   Anne Ng, PA-C    Physical Exam: Vitals:   10/24/19 2144 10/25/19 0400 10/25/19 0606 10/25/19 0630  BP:  (!) 118/94 124/82 (!) 141/88  Pulse:   88 78  Resp:   16   Temp:      TempSrc:      SpO2:   96% 94%  Weight: 77.1 kg     Height: 6' (1.829 m)       Constitutional: NAD, calm, comfortable Eyes: PERRL, lids and conjunctivae normal ENMT: Mucous membranes are moist. Posterior pharynx clear of any exudate or lesions.Normal dentition.  Neck: normal, supple Respiratory: clear to auscultation bilaterally, no wheezing, no crackles. Normal respiratory effort. No accessory muscle use.  Cardiovascular: Regular rate and rhythm, no murmurs / rubs / gallops. No extremity edema. 2+ pedal pulses.  Abdomen: Diffusely tender to palpation, no guarding or rebound.  Diminished bowel sounds. Musculoskeletal: no clubbing / cyanosis. Normal muscle tone.  Skin: no rashes, lesions, ulcers. No induration Neurologic: CN 2-12 grossly intact. Strength 5/5 in all 4.  Psychiatric: Normal judgment and insight. Alert and oriented x 3. Normal mood.   Labs on Admission: I have personally reviewed following labs and imaging studies  CBC: Recent Labs  Lab 10/24/19 2153  WBC 15.3*  HGB 17.6*  HCT 49.0  MCV 96.5  PLT 657   Basic Metabolic Panel: Recent Labs  Lab 10/24/19 2153  NA 138  K 4.1  CL 98  CO2 26  GLUCOSE 141*  BUN 23*  CREATININE 0.89  CALCIUM 9.8   Liver Function Tests: Recent Labs  Lab 10/24/19 2153  AST 21  ALT 16  ALKPHOS 64  BILITOT 1.7*  PROT 8.3*  ALBUMIN 4.6   Coagulation Profile: No results for input(s): INR, PROTIME in the last 168 hours. BNP (last 3 results) No results for input(s): PROBNP in the last 8760 hours. CBG: No results for input(s): GLUCAP in the last 168 hours. Thyroid Function Tests: No results for input(s): TSH, T4TOTAL, FREET4, T3FREE, THYROIDAB in the last 72 hours. Urine analysis:    Component  Value Date/Time   COLORURINE AMBER (A) 10/24/2019 2153   APPEARANCEUR CLEAR (A) 10/24/2019 2153   LABSPEC 1.026 10/24/2019 2153   PHURINE 5.0 10/24/2019 2153   GLUCOSEU NEGATIVE 10/24/2019 2153   HGBUR NEGATIVE 10/24/2019 2153   BILIRUBINUR NEGATIVE 10/24/2019 2153   KETONESUR 20 (A) 10/24/2019 2153   PROTEINUR 30 (A) 10/24/2019 2153   NITRITE NEGATIVE 10/24/2019 2153   LEUKOCYTESUR NEGATIVE 10/24/2019 2153     Radiological Exams on Admission: CT ABDOMEN PELVIS W CONTRAST  Result Date: 10/25/2019 CLINICAL DATA:  Acute generalized abdominal pain. Suspected food poisoning EXAM: CT ABDOMEN AND PELVIS WITH CONTRAST TECHNIQUE: Multidetector CT imaging of the abdomen and pelvis was performed using the standard protocol following bolus administration of intravenous contrast. CONTRAST:  OMNIPAQUE IOHEXOL 300 MG/ML  SOLN COMPARISON:  09/20/2019 abdominal CT FINDINGS: Lower chest:  Hyperinflation with right lower lobe scarring. Hepatobiliary: Small cysts in the lower central liver an even smaller cystic density focus in the subcapsular right liver.No evidence of biliary obstruction or stone. Pancreas: Unremarkable. Spleen: Surgically absent Adrenals/Urinary Tract: Negative adrenals. No hydronephrosis or stone. Unremarkable bladder. Stomach/Bowel: Dilated and fluid-filled small bowel with fecalization before an abrupt transition point to decompressed bowel, high-grade obstruction. The transition point is in the central ventral abdomen just below the umbilicus where there is angulation of bowel, presumably from adhesions. No visible underlying mass or inflammatory bowel wall thickening. No appendicitis. Vascular/Lymphatic: Mild atherosclerotic calcification of the aorta and iliacs. No mass or adenopathy. Reproductive:Dystrophic prostate calcification. Phlebolith like calcifications in the spermatic cords and corpora. Other: Small pelvic ascites considered reactive. Musculoskeletal: Disc and facet  degeneration at L2-3 and below with multilevel spinal stenosis accentuated by short pedicles. IMPRESSION: 1. High-grade small bowel obstruction with transition point just deep to the umbilicus, likely adhesion. 2. Splenectomy. Electronically Signed   By: Marnee Spring M.D.   On: 10/25/2019 04:31    EKG: Independently reviewed.  Sinus rhythm  Assessment/Plan  Principal Problem Abdominal pain, nausea vomiting due to small bowel obstruction -Dr. Claudine Mouton with general surgery has been consulted, for now continue supportive management with n.p.o., IV fluids, pain control -Patient hesitant for NG tube and wants to be put to sleep, will give a one-time low-dose Ativan  -Further management per surgery  Active Problems COPD -No wheezing, stable, patient tells me he is taking Spiriva, continue, will also placed on albuterol as needed  Leukocytosis -Likely reactive in the setting of #1, no URI no evidence of infection  Tobacco use -will need cessation  Splenectomy patient -Noted  DVT prophylaxis: Lovenox  Code Status: Full code  Family Communication: no family at bedside  Disposition Plan: home when ready  Bed Type: medsurg Consults called: general surgery   Obs/Inp: Observation  Pamella Pert, MD, PhD [facility]  Contact via [email or web address]  10/25/2019, 7:38 AM

## 2019-10-25 NOTE — ED Notes (Signed)
Surgeons at bedside at this time.

## 2019-10-25 NOTE — ED Notes (Signed)
This RN to bedside, introduced self to patient. Covid swab obtained by this RN at this time.

## 2019-10-25 NOTE — ED Notes (Signed)
This RN to bedside at this time. Pt visualized in NAD. Pt resting in bed with eyes closed, respirations even and unlabored. Lights remain dimmed for patient comfort. VSS. Will continue to monitor for further patient needs.

## 2019-10-25 NOTE — ED Notes (Signed)
Entered room to place NGT and pt was insistent that he would need to be put to sleep to have it done. Discussed possibly medication to help him relax and pt states that no that doesn't seem like a good idea. Pt states he had an upper GI about 6 weeks ago and they just put him to sleep and it was a simple procedure.  Explained to the pt the purpose of the NGT and how it would need to stay and be placed to suction. Pt refuses to have the NGT tube at this time and states he will discuss it with the surgeon

## 2019-10-25 NOTE — ED Notes (Signed)
Per Admitting MD, do not change position of NG tube at this time.

## 2019-10-25 NOTE — ED Notes (Signed)
Meds administered per MD order, explained to patient would return in approx 10 mins to give medication enough time to kick in prior to attempting NG tube, patient states understanding. Pt questioning pain medication at this time due to taking Oxy Q4 for many years. This RN explained would review orders however pt NPO due to SBO and NG tube.

## 2019-10-25 NOTE — ED Notes (Signed)
This RN to bedside due to patient calling out. Pt states that he was afraid something leaking from the NG suction set up. This RN verified NG patent and suctioning at this time. Pt visualized in NAD at this time.

## 2019-10-25 NOTE — ED Notes (Signed)
This RN to bedside, pt resting in bed with eyes closed, respirations even and unlabored, skin warm, dry, and intact. Lights remain dimmed for patient comfort at this time.

## 2019-10-25 NOTE — Progress Notes (Signed)
Walked in to check on patient and he asked if he could get IV pain meds. I told him I would have to check with the doctor and he instantly became irate and he said he didn't have to stay here. I asked him if he wanted to talk to the doctor and he said "Yeah, you better go get him, because I'm leaving if you guys won't give me IV pain meds." I told him I would call the doctor so he could speak to him and left the room and paged Dr. Renne Crigler who immediately called me and I told him the situation and pt was threatening to leave. He said he'd come right down. I then called security b/c I felt pt got irate very quickly and I was concerned that he might try to pull his IV out and NG tube that was still in his nose. Right after I called security, the patient opened his room door, fully dressed, having taken out his own NG tube and IV and started walking around until he found the exit of the lobby and left. Patient was given the opportunity to speak to the doctor and charge nurse and refused. Patient left without speaking to anyone on the floor.

## 2019-10-25 NOTE — ED Notes (Signed)
This RN to bedside with pain medication per PRN orders and patient request due to taking his Oxy "on a specific schedule". When this RN explained morphine IV for pain, pt states, "it ain't gone do nothing, I'd rather take the Oxy", this RN explained to patient NPO status again due to SBO and that he could not have Oxy, pt then requesting it IV, this RN explained Oxy did not come in IV form. Pt agreeable to taking medication. Pt states he does not take Spiriva, held due to patient stating he takes Symbicort. Pt repositioned in bed, lights dimmed for patient comfort at this time. Admitting MD made aware of patient complaints and patients meds.   Pt's DG abdomen reads NG tube in good place, pt hooked to low intermittent suction via wall at this time. This RN reinforced teaching of NG tube at this time. Will continue to monitor. Call bell within reach.

## 2019-10-25 NOTE — ED Notes (Signed)
This RN to bedside due to patient calling out, pt requesting urinal to use at this time. Pt given urinal at this time, pt repositioned at this time.

## 2019-10-25 NOTE — Discharge Summary (Signed)
No charge  Discharge summary, patient left AMA  Please see H&P earlier this morning, patient admitted to the hospital for small bowel obstruction.  Towards the end of the day decided to leave AMA and did so before this MD discussed with the patient.  He is on chronic oxycodone and per RN, he was unhappy with the IV pain medicine regimen  Ysenia Filice M. Cruzita Lederer, MD, PhD [facility]

## 2019-10-25 NOTE — ED Notes (Signed)
Message sent to pharmacy regarding patient meds.  

## 2019-10-25 NOTE — Consult Note (Signed)
[facility] SURGICAL CONSULTATION NOTE (initial) - cpt: 25053 (Outpatient/ED)   HISTORY OF PRESENT ILLNESS (HPI):  58 y.o. male presented to [facility] ED overnight for abdominal pain. Patient reports that on Saturday morning he noticed the acute onset of upper abdominal pain around 5AM. The pain persisted throughout the day and into Sunday night which prompted his presentation. He described this pain as a sharp crampy pain and rated it a 9/10. He though maybe he had food poisoning from eating "a few raspberries." He endorses associated nausea and emesis. Unsure if passing flatus. No history of similar pain in the past. He does have an extensive abdominal surgical history including splenectomy and reportedly incisional hernia repairs. Work up in the ED was concerning for mild leukocytosis to 15K and small bowel obstruction with transition point near umbilicus.   General surgery is consulted by emergency medicine physician Dr Loyce Dys, MD for evaluation and management of SBO.    PAST MEDICAL HISTORY (PMH):  Past Medical History:  Diagnosis Date  . Hepatitis C     Reviewed. Otherwise negative.   PAST SURGICAL HISTORY (PSH):  History reviewed. No pertinent surgical history.  Reviewed. Otherwise negative.   MEDICATIONS:  Prior to Admission medications   Medication Sig Start Date End Date Taking? Authorizing Provider  ibuprofen (ADVIL,MOTRIN) 800 MG tablet Take 1 tablet (800 mg total) by mouth 3 (three) times daily. 05/31/16   Sam, Ace Gins, PA-C     ALLERGIES:  Allergies  Allergen Reactions  . Penicillins      SOCIAL HISTORY:  Social History   Socioeconomic History  . Marital status: Single    Spouse name: Not on file  . Number of children: Not on file  . Years of education: Not on file  . Highest education level: Not on file  Occupational History  . Not on file  Tobacco Use  . Smoking status: Current Every Day Smoker    Types: Cigarettes  . Smokeless  tobacco: Never Used  Substance and Sexual Activity  . Alcohol use: Yes    Comment: occ  . Drug use: No  . Sexual activity: Not on file  Other Topics Concern  . Not on file  Social History Narrative  . Not on file   Social Determinants of Health   Financial Resource Strain:   . Difficulty of Paying Living Expenses: Not on file  Food Insecurity:   . Worried About Programme researcher, broadcasting/film/video in the Last Year: Not on file  . Ran Out of Food in the Last Year: Not on file  Transportation Needs:   . Lack of Transportation (Medical): Not on file  . Lack of Transportation (Non-Medical): Not on file  Physical Activity:   . Days of Exercise per Week: Not on file  . Minutes of Exercise per Session: Not on file  Stress:   . Feeling of Stress : Not on file  Social Connections:   . Frequency of Communication with Friends and Family: Not on file  . Frequency of Social Gatherings with Friends and Family: Not on file  . Attends Religious Services: Not on file  . Active Member of Clubs or Organizations: Not on file  . Attends Banker Meetings: Not on file  . Marital Status: Not on file  Intimate Partner Violence:   . Fear of Current or Ex-Partner: Not on file  . Emotionally Abused: Not on file  . Physically Abused: Not on file  . Sexually Abused: Not on file  FAMILY HISTORY:  History reviewed. No pertinent family history.  Otherwise negative.   REVIEW OF SYSTEMS:  Review of Systems  Constitutional: Negative for chills and fever.  HENT: Negative for congestion and sore throat.   Respiratory: Negative for cough and shortness of breath.   Cardiovascular: Negative for chest pain and palpitations.  Gastrointestinal: Positive for abdominal pain, nausea and vomiting. Negative for blood in stool, constipation and diarrhea.  Neurological: Negative for dizziness and headaches.    VITAL SIGNS:  Temp:  [98.9 F (37.2 C)] 98.9 F (37.2 C) (12/13 2143) Pulse Rate:  [67-93] 71 (12/14  0930) Resp:  [16-18] 16 (12/14 0606) BP: (118-141)/(82-97) 136/91 (12/14 0930) SpO2:  [90 %-96 %] 90 % (12/14 0930) Weight:  [77.1 kg] 77.1 kg (12/13 2144)     Height: 6' (182.9 cm) Weight: 77.1 kg BMI (Calculated): 23.05   PHYSICAL EXAM:  Physical Exam Vitals and nursing note reviewed.  Constitutional:      General: He is not in acute distress.    Appearance: He is well-developed and normal weight. He is not ill-appearing.  HENT:     Head: Normocephalic and atraumatic.  Eyes:     General: No scleral icterus.    Extraocular Movements: Extraocular movements intact.  Cardiovascular:     Rate and Rhythm: Normal rate and regular rhythm.     Heart sounds: Normal heart sounds. No murmur. No friction rub. No gallop.   Pulmonary:     Effort: Pulmonary effort is normal. No respiratory distress.     Breath sounds: Normal breath sounds. No wheezing or rhonchi.  Chest:     Comments: Large left lateral chest/flank scar Abdominal:     General: Abdomen is flat. A surgical scar is present. There is no distension.     Palpations: Abdomen is soft.     Tenderness: There is abdominal tenderness in the epigastric area and periumbilical area. There is no guarding or rebound.     Comments: Multiple healed surgical scars including previous midline laparotomy, he is tender primarily in the epigastrium and umbilicus, no peritonitis   Genitourinary:    Comments: Deferred Skin:    General: Skin is warm and dry.     Coloration: Skin is not jaundiced or pale.  Neurological:     General: No focal deficit present.     Mental Status: He is alert and oriented to person, place, and time.  Psychiatric:        Mood and Affect: Mood normal.        Behavior: Behavior normal.     INTAKE/OUTPUT:  This shift: Total I/O In: 1000 [IV Piggyback:1000] Out: -   Last 2 shifts: @IOLAST2SHIFTS @  Labs:  CBC Latest Ref Rng & Units 10/24/2019 05/31/2016  WBC 4.0 - 10.5 K/uL 15.3(H) 8.8  Hemoglobin 13.0 - 17.0 g/dL  17.6(H) 15.1  Hematocrit 39.0 - 52.0 % 49.0 43.1  Platelets 150 - 400 K/uL 325 322   CMP Latest Ref Rng & Units 10/24/2019 05/31/2016  Glucose 70 - 99 mg/dL 161(W141(H) 89  BUN 6 - 20 mg/dL 96(E23(H) 13  Creatinine 4.540.61 - 1.24 mg/dL 0.980.89 1.190.87  Sodium 147135 - 145 mmol/L 138 141  Potassium 3.5 - 5.1 mmol/L 4.1 4.5  Chloride 98 - 111 mmol/L 98 105  CO2 22 - 32 mmol/L 26 31  Calcium 8.9 - 10.3 mg/dL 9.8 9.7  Total Protein 6.5 - 8.1 g/dL 8.3(H) -  Total Bilirubin 0.3 - 1.2 mg/dL 8.2(N1.7(H) -  Alkaline Phos 38 -  126 U/L 64 -  AST 15 - 41 U/L 21 -  ALT 0 - 44 U/L 16 -     Imaging studies:   CT Abdomen/Pelvis (10/25/2019) personally reviewed concerning for SBO with transition point in the mid-abdomen, and radiologist report reviewed below:  IMPRESSION: 1. High-grade small bowel obstruction with transition point just deep to the umbilicus, likely adhesion. 2. Splenectomy.   Assessment/Plan: (ICD-10's: K50.609) 58 y.o. male with small bowel obstruction most likely secondary to post-surgical adhesions given his extensive abdominal surgical history   - Admit to medicine service  - NPO + IVF  - Place NGT; LIS; monitor output   - pain control prn; antiemetics prn  - Monitor abdominal examination +/- serial KUBs; monitor on-going bowel function   - No emergent surgical intervention; however; he understands if he fails conservative management he would likely require intervention   - further management per primary service   All of the above findings and recommendations were discussed with the patient, and all of his questions were answered to his expressed satisfaction.  -- Edison Simon, PA-C [facility] 10/25/2019, 9:53 AM [phone] M-F: 7am - 4pm

## 2019-10-25 NOTE — ED Notes (Signed)
Patient to stat desk in no acute distress asking about wait time. Patient given update on wait time. Patient verbalizes understanding.  

## 2021-03-31 IMAGING — CT CT ABD-PELV W/ CM
2 of 5 series · 16 of 46 positions shown, 18 images · IV contrast (APPLIED)
Comparison: 09/20/2019 abdominal CT

CLINICAL DATA: Acute generalized abdominal pain. Suspected food
poisoning

EXAM:
CT ABDOMEN AND PELVIS WITH CONTRAST
TECHNIQUE: Multidetector CT imaging of the abdomen and pelvis was performed
using the standard protocol following bolus administration of
intravenous contrast.
CONTRAST:  100mL OMNIPAQUE IOHEXOL 300 MG/ML  SOLN

[Series 2: routine abd/pel with · axial · 0.87mm/px · z∈[-1041,-666]mm · 13 of 85 slices shown, 15 images]
[im 5/85  soft-tissue]
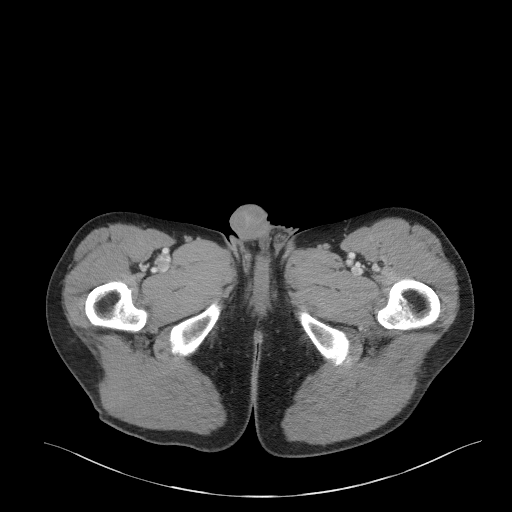
[im 5/85  bone]
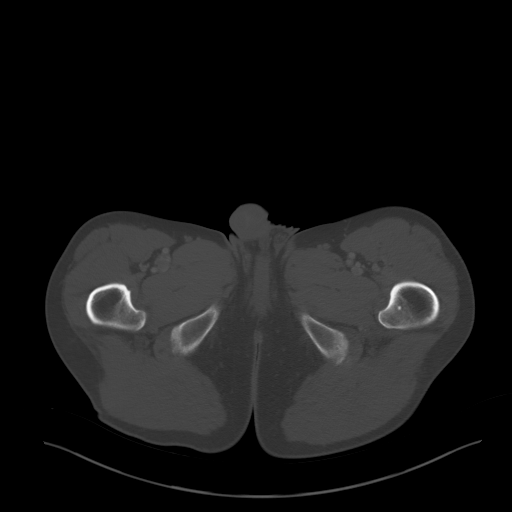
[im 14/85  soft-tissue]
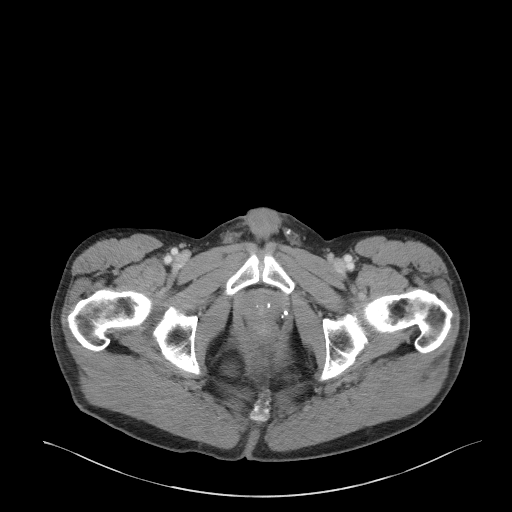
[im 18/85  soft-tissue]
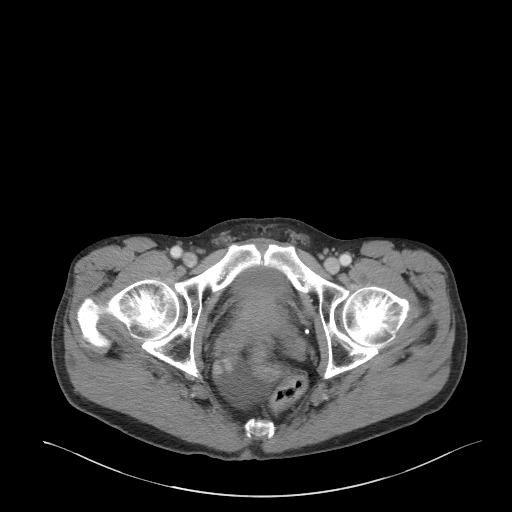
[im 23/85  soft-tissue]
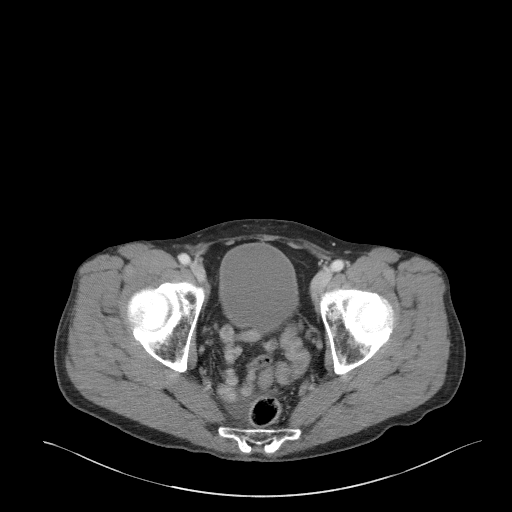
[im 31/85  soft-tissue]
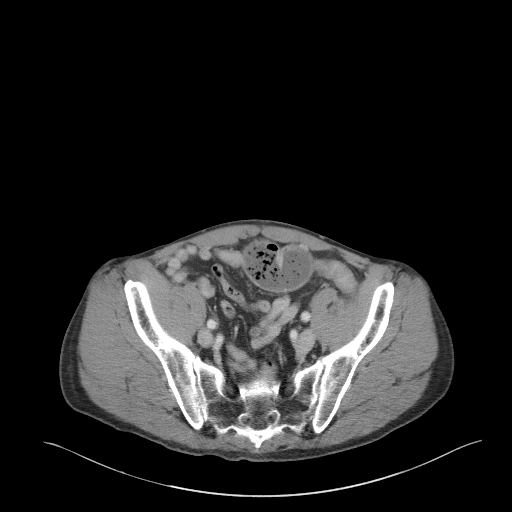
[im 36/85  soft-tissue]
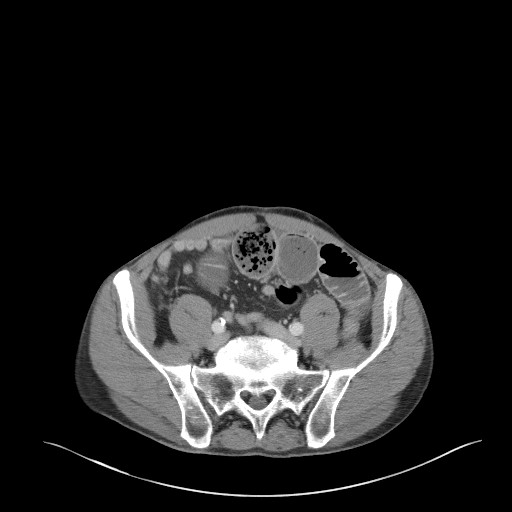
[im 45/85  soft-tissue]
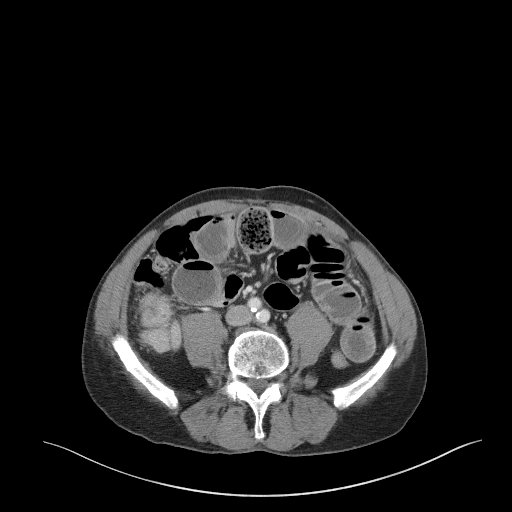
[im 49/85  soft-tissue]
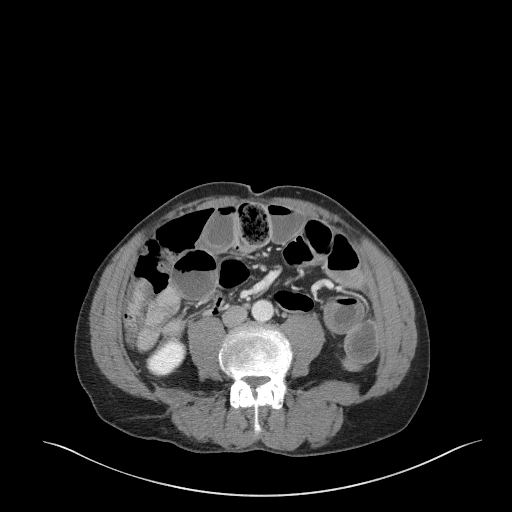
[im 54/85  soft-tissue]
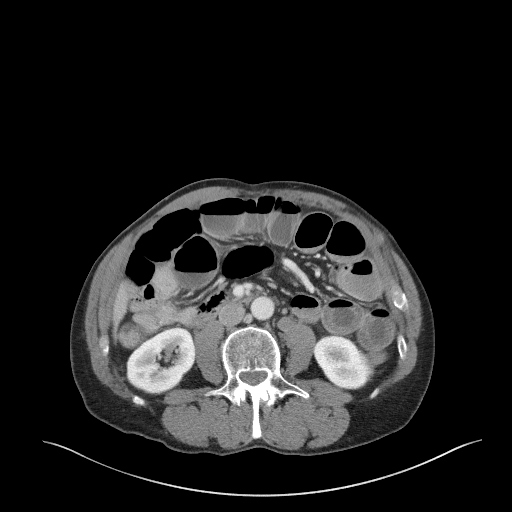
[im 54/85  bone]
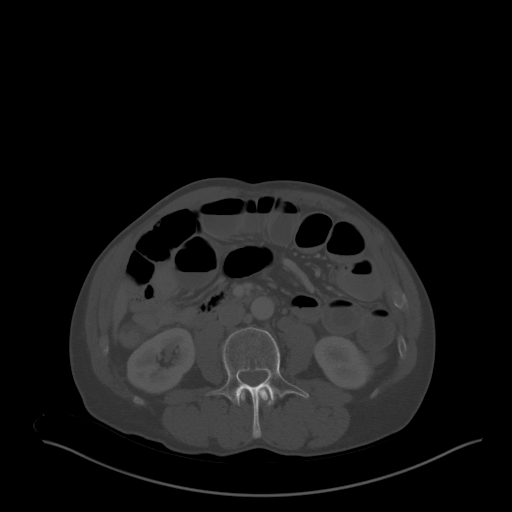
[im 62/85  soft-tissue]
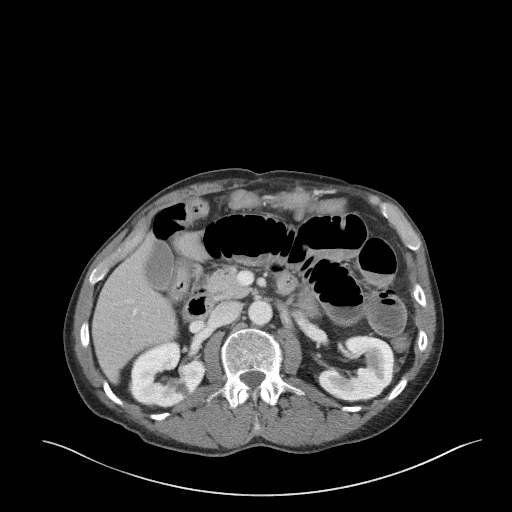
[im 67/85  soft-tissue]
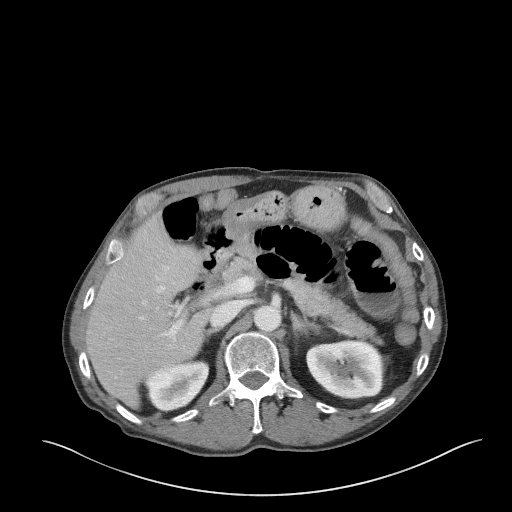
[im 71/85  soft-tissue]
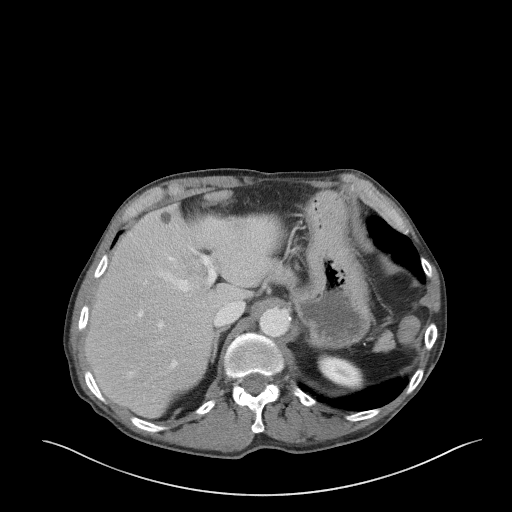
[im 80/85  soft-tissue]
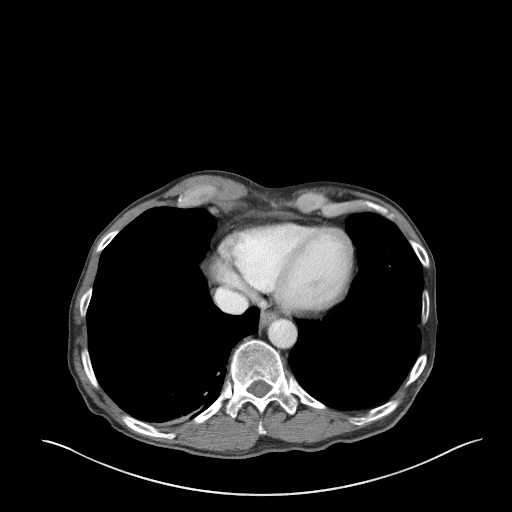

[Series 5: coronal st · coronal · 0.71mm/px · 3 of 82 slices shown]
[im 28/82  soft-tissue]
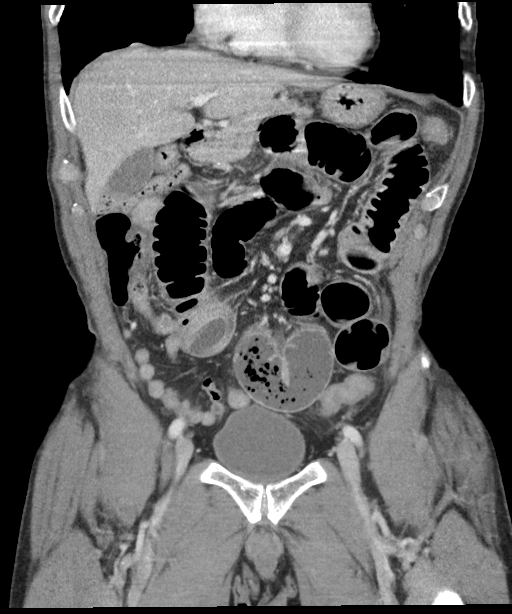
[im 37/82  soft-tissue]
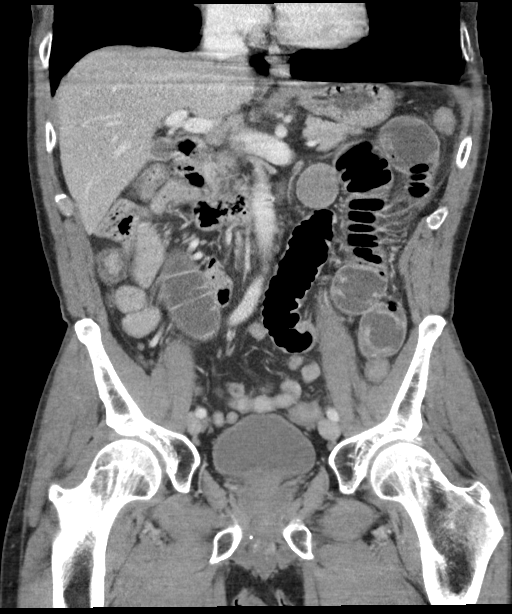
[im 46/82  soft-tissue]
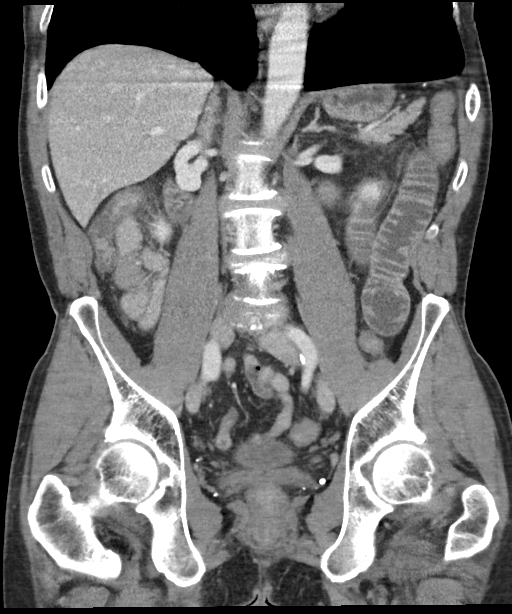

[16 of 46 positions shown; findings below may reference images not displayed]

FINDINGS: Lower chest:  Hyperinflation with right lower lobe scarring.

Hepatobiliary: Small cysts in the lower central liver an even
smaller cystic density focus in the subcapsular right liver.No
evidence of biliary obstruction or stone.

Pancreas: Unremarkable.

Spleen: Surgically absent

Adrenals/Urinary Tract: Negative adrenals. No hydronephrosis or
stone. Unremarkable bladder.

Stomach/Bowel: Dilated and fluid-filled small bowel with
fecalization before an abrupt transition point to decompressed
bowel, high-grade obstruction. The transition point is in the
central ventral abdomen just below the umbilicus where there is
angulation of bowel, presumably from adhesions. No visible
underlying mass or inflammatory bowel wall thickening. No
appendicitis.

Vascular/Lymphatic: Mild atherosclerotic calcification of the aorta
and iliacs. No mass or adenopathy.

Reproductive:Dystrophic prostate calcification. Phlebolith like
calcifications in the spermatic cords and corpora.

Other: Small pelvic ascites considered reactive.

Musculoskeletal: Disc and facet degeneration at L2-3 and below with
multilevel spinal stenosis accentuated by short pedicles.
IMPRESSION: 1. High-grade small bowel obstruction with transition point just
deep to the umbilicus, likely adhesion.
2. Splenectomy.
